# Patient Record
Sex: Female | Born: 1965 | Race: White | Hispanic: No | Marital: Married | Smoking: Never smoker
Health system: Southern US, Community
[De-identification: ages and names within clinical notes are randomized; demographics above are authoritative.]

## PROBLEM LIST (undated history)

## (undated) ENCOUNTER — Emergency Department (HOSPITAL_BASED_OUTPATIENT_CLINIC_OR_DEPARTMENT_OTHER): Admission: EM | Payer: BLUE CROSS/BLUE SHIELD

## (undated) DIAGNOSIS — R350 Frequency of micturition: Secondary | ICD-10-CM

## (undated) DIAGNOSIS — R300 Dysuria: Secondary | ICD-10-CM

## (undated) DIAGNOSIS — N3945 Continuous leakage: Secondary | ICD-10-CM

## (undated) DIAGNOSIS — R011 Cardiac murmur, unspecified: Secondary | ICD-10-CM

## (undated) DIAGNOSIS — G43909 Migraine, unspecified, not intractable, without status migrainosus: Secondary | ICD-10-CM

## (undated) HISTORY — DX: Continuous leakage: N39.45

## (undated) HISTORY — DX: Migraine, unspecified, not intractable, without status migrainosus: G43.909

## (undated) HISTORY — DX: Cardiac murmur, unspecified: R01.1

## (undated) HISTORY — DX: Dysuria: R30.0

## (undated) HISTORY — DX: Frequency of micturition: R35.0

## (undated) HISTORY — PX: OTHER SURGICAL HISTORY: SHX169

---

## 2005-12-16 ENCOUNTER — Ambulatory Visit: Payer: Self-pay

## 2006-03-14 ENCOUNTER — Emergency Department: Payer: Self-pay | Admitting: Emergency Medicine

## 2006-12-31 ENCOUNTER — Ambulatory Visit: Payer: Self-pay

## 2008-02-11 ENCOUNTER — Ambulatory Visit: Payer: Self-pay

## 2009-06-12 ENCOUNTER — Ambulatory Visit: Payer: Self-pay

## 2011-01-14 ENCOUNTER — Ambulatory Visit: Payer: Self-pay | Admitting: Family Medicine

## 2015-02-09 ENCOUNTER — Ambulatory Visit (INDEPENDENT_AMBULATORY_CARE_PROVIDER_SITE_OTHER): Payer: BLUE CROSS/BLUE SHIELD | Admitting: Urology

## 2015-02-09 ENCOUNTER — Encounter: Payer: Self-pay | Admitting: *Deleted

## 2015-02-09 VITALS — BP 145/75 | HR 70 | Ht 69.0 in | Wt 160.4 lb

## 2015-02-09 DIAGNOSIS — N390 Urinary tract infection, site not specified: Secondary | ICD-10-CM

## 2015-02-09 DIAGNOSIS — N3090 Cystitis, unspecified without hematuria: Secondary | ICD-10-CM | POA: Insufficient documentation

## 2015-02-09 DIAGNOSIS — N952 Postmenopausal atrophic vaginitis: Secondary | ICD-10-CM | POA: Insufficient documentation

## 2015-02-09 LAB — URINALYSIS, COMPLETE
Bilirubin, UA: NEGATIVE
GLUCOSE, UA: NEGATIVE
Ketones, UA: NEGATIVE
Nitrite, UA: NEGATIVE
PROTEIN UA: NEGATIVE
Specific Gravity, UA: 1.015 (ref 1.005–1.030)
UUROB: 0.2 mg/dL (ref 0.2–1.0)
pH, UA: 7.5 (ref 5.0–7.5)

## 2015-02-09 LAB — MICROSCOPIC EXAMINATION: BACTERIA UA: NONE SEEN

## 2015-02-09 LAB — BLADDER SCAN AMB NON-IMAGING: Scan Result: 0

## 2015-02-09 NOTE — Progress Notes (Signed)
02/09/2015 2:03 PM   Susan Garcia 1965-07-04 [id number]  Referring provider: No referring provider defined for this encounter.  Chief Complaint  Patient presents with  . Recurrent UTI    referral from [facility] Bobby Rumpf MD    HPI:  1 - Recurrent Cystitis - pt with ocacional episode of simple cystitis up to every other month. Symptoms dysuria, freqency that improve after specific therapy. No febrile UTI / pyelo hospitalizations. No recent positive CX data to review. Pelvic 01/2015 with very mild atrophic changes. PVR 01/2015 "0mL"  2 - Atrophic Vaginitis - very mild early atrophic changes by pelvic 01/2015 on eval recurrent UTI. Is sexually active. Is s/p hyst with ovaries in situ. No h/o estrogen sensitive malignancy.   PMH sig for lap hyst (benign), headaches. No CV disease. No strong blood thinners.   [name]   PMH: Past Medical History  Diagnosis Date  . Dysuria   . Urinary frequency   . Murmur   . Migraine headache   . Continuous leakage of urine     Surgical History: Past Surgical History  Procedure Laterality Date  . Removal etopic pragnancy    . Laproscopic hysterectomy      Home Medications:    Medication List       This list is accurate as of: 02/09/15  2:03 PM.  Always use your most recent med list.               diclofenac 50 MG tablet  Commonly known as:  CATAFLAM  Take 50 mg by mouth 3 (three) times daily.     gabapentin 100 MG capsule  Commonly known as:  NEURONTIN  TK ONE C PO  BID PRN     meclizine 25 MG tablet  Commonly known as:  ANTIVERT  Take 25 mg by mouth 3 (three) times daily as needed for dizziness.     nortriptyline 10 MG capsule  Commonly known as:  PAMELOR  Take 10 mg by mouth at bedtime.     phenazopyridine 200 MG tablet  Commonly known as:  PYRIDIUM  Take 200 mg by mouth 3 (three) times daily as needed for pain.     promethazine 25 MG tablet  Commonly known as:   PHENERGAN  Take 25 mg by mouth every 6 (six) hours as needed for nausea or vomiting.     triamcinolone cream 0.5 %  Commonly known as:  KENALOG  Apply 1 application topically 3 (three) times daily.     venlafaxine 37.5 MG tablet  Commonly known as:  EFFEXOR  Take 37.5 mg by mouth 2 (two) times daily.        Allergies:  Allergies  Allergen Reactions  . Darvon [Propoxyphene]   . Percocet [Oxycodone-Acetaminophen]     Family History: Family History  Problem Relation Age of Onset  . Diabetes type II Mother   . Hypertension Mother   . Diabetes type II Maternal Grandmother   . Hypertension Maternal Grandmother   . Cancer Maternal Grandmother   . Kidney disease Neg Hx     Social History:  reports that she has never smoked. She does not have any smokeless tobacco history on file. She reports that she drinks alcohol. She reports that she does not use illicit drugs.  ROS: UROLOGY Frequent Urination?: Yes Hard to postpone urination?: Yes Burning/pain with urination?: No Get up at night to urinate?: No Leakage of urine?: Yes Urine stream  starts and stops?: No Trouble starting stream?: No Do you have to strain to urinate?: No Blood in urine?: No Urinary tract infection?: No Sexually transmitted disease?: No Injury to kidneys or bladder?: No Painful intercourse?: No Weak stream?: No Currently pregnant?: No Vaginal bleeding?: No Last menstrual period?: nn  Gastrointestinal Nausea?: No Vomiting?: No Indigestion/heartburn?: No Diarrhea?: No Constipation?: No  Constitutional Fever: No Night sweats?: No Weight loss?: No Fatigue?: No  Skin Skin rash/lesions?: No Itching?: No  Eyes Blurred vision?: No Double vision?: No  Ears/Nose/Throat Sore throat?: No Sinus problems?: No  Hematologic/Lymphatic Swollen glands?: No Easy bruising?: No  Cardiovascular Leg swelling?: No Chest pain?: No  Respiratory Cough?: No Shortness of breath?:  No  Endocrine Excessive thirst?: No  Musculoskeletal Back pain?: No Joint pain?: No  Neurological Headaches?: Yes Dizziness?: Yes  Psychologic Depression?: No Anxiety?: No  Physical Exam: BP 145/75 mmHg  Pulse 70  Ht  (1.753 m)  Wt 160 lb 6.4 oz (72.757 kg)  BMI 23.68 kg/m2  Constitutional:  Alert and oriented, No acute distress. HEENT: [place] AT, moist mucus membranes.  Trachea midline, no masses. Cardiovascular: No clubbing, cyanosis, or edema. Respiratory: Normal respiratory effort, no increased work of breathing. GI: Abdomen is soft, nontender, nondistended, no abdominal masses GU: No CVA tenderness. Mild vaginal atrophy. Cervix / uterus surgiclaly absent.  Skin: No rashes, bruises or suspicious lesions. Lymph: No cervical or inguinal adenopathy. Neurologic: Grossly intact, no focal deficits, moving all 4 extremities. Psychiatric: Normal mood and affect.  Laboratory Data: No results found for: WBC, HGB, HCT, MCV, PLT  No results found for: CREATININE  No results found for: PSA  No results found for: TESTOSTERONE  No results found for: HGBA1C  Urinalysis No results found for: COLORURINE, APPEARANCEUR, LABSPEC, PHURINE, GLUCOSEU, HGBUR, BILIRUBINUR, KETONESUR, PROTEINUR, UROBILINOGEN, NITRITE, LEUKOCYTESUR  Pertinent Imaging:  PVR "0mL"  Assessment & Plan:    1 - Recurrent Cystitis - discssed w/o with goal of ruling out surgicaly modifiable factors with pelvic, PVR, KUB, Renal US. Exam today with only mild vaginal atrophy.   KUB, Renal US on return to r/o stones / hydro. If normal, then consider estrace twice weekly and keflex self-start.   2 - Atrophic Vaginitis - consider estrace twice weekly if eval otherise unremarkable.  3 - RTC 1 mo with KUB, Renal US.    Return in about 4 weeks (around 03/09/2015).  Sebastian Ache, MD  [facility] [address], [address] [place], [place] [address] [phone]

## 2015-02-14 ENCOUNTER — Other Ambulatory Visit: Payer: Self-pay

## 2015-02-14 ENCOUNTER — Other Ambulatory Visit: Payer: Self-pay | Admitting: Urology

## 2015-02-14 DIAGNOSIS — N390 Urinary tract infection, site not specified: Secondary | ICD-10-CM

## 2015-03-09 ENCOUNTER — Ambulatory Visit: Payer: BLUE CROSS/BLUE SHIELD | Admitting: Urology

## 2015-03-09 ENCOUNTER — Encounter: Payer: Self-pay | Admitting: Urology

## 2015-03-26 ENCOUNTER — Telehealth: Payer: Self-pay | Admitting: Urology

## 2015-03-26 NOTE — Telephone Encounter (Signed)
Pt called.  She received a no show letter from [place].  She stated that she never received a call from the hospital to schedule her tests and that she did not want to return to see [place].  I double checked her telephone # and we had an incorrect #.  I changed the # to the correct # and apologized to the patient, offered to schedule and offered to give her the # for scheduling at the hospital.  Pt refused and said she would call us back if she was in anymore pain or if she needed [place].

## 2015-12-13 ENCOUNTER — Other Ambulatory Visit: Payer: Self-pay | Admitting: Obstetrics & Gynecology

## 2015-12-13 DIAGNOSIS — Z1231 Encounter for screening mammogram for malignant neoplasm of breast: Secondary | ICD-10-CM

## 2015-12-26 ENCOUNTER — Other Ambulatory Visit: Payer: Self-pay | Admitting: Obstetrics & Gynecology

## 2015-12-26 ENCOUNTER — Ambulatory Visit
Admission: RE | Admit: 2015-12-26 | Discharge: 2015-12-26 | Disposition: A | Discharge status: Home / Self Care | Payer: BLUE CROSS/BLUE SHIELD | Source: Ambulatory Visit | Attending: Obstetrics & Gynecology | Admitting: Obstetrics & Gynecology

## 2015-12-26 DIAGNOSIS — Z1231 Encounter for screening mammogram for malignant neoplasm of breast: Secondary | ICD-10-CM | POA: Diagnosis present

## 2016-07-08 ENCOUNTER — Emergency Department
Admission: EM | Admit: 2016-07-08 | Discharge: 2016-07-08 | Disposition: A | Discharge status: Home / Self Care | Payer: Worker's Compensation | Attending: Emergency Medicine | Admitting: Emergency Medicine

## 2016-07-08 ENCOUNTER — Encounter: Payer: Self-pay | Admitting: *Deleted

## 2016-07-08 ENCOUNTER — Emergency Department: Payer: Worker's Compensation

## 2016-07-08 DIAGNOSIS — Y929 Unspecified place or not applicable: Secondary | ICD-10-CM | POA: Insufficient documentation

## 2016-07-08 DIAGNOSIS — M79602 Pain in left arm: Secondary | ICD-10-CM | POA: Diagnosis not present

## 2016-07-08 DIAGNOSIS — Y999 Unspecified external cause status: Secondary | ICD-10-CM | POA: Diagnosis not present

## 2016-07-08 DIAGNOSIS — S4992XA Unspecified injury of left shoulder and upper arm, initial encounter: Secondary | ICD-10-CM | POA: Diagnosis present

## 2016-07-08 DIAGNOSIS — W010XXA Fall on same level from slipping, tripping and stumbling without subsequent striking against object, initial encounter: Secondary | ICD-10-CM | POA: Insufficient documentation

## 2016-07-08 DIAGNOSIS — Y939 Activity, unspecified: Secondary | ICD-10-CM | POA: Diagnosis not present

## 2016-07-08 DIAGNOSIS — W19XXXA Unspecified fall, initial encounter: Secondary | ICD-10-CM

## 2016-07-08 NOTE — ED Triage Notes (Signed)
Patient states she tripped over a rug today at work and fell on left hand.

## 2016-07-08 NOTE — ED Provider Notes (Signed)
[facility] Emergency Department Provider Note  ____________________________________________  Time seen: Approximately 3:19 PM  I have reviewed the triage vital signs and the nursing notes.   HISTORY  Chief Complaint Fall    HPI Susan Garcia is a 51 y.o. female presents to emergency department after falling on left hand after tripping on a rug. She states that she is having pain over her pinky finger and down hand. Patient states that it has been swelling since the injury. Patient says there is some numbness but she can feel the tip of her finger. Patient denies any additional injuries. No head trauma or loss of consciousness. Patient denies any shoulder, elbow, wrist pain. Patient has not taken anything for pain.   Past Medical History:  Diagnosis Date  . Continuous leakage of urine   . Dysuria   . Migraine headache   . Murmur   . Urinary frequency     Patient Active Problem List   Diagnosis Date Noted  . Recurrent cystitis 02/09/2015  . Atrophic vaginitis 02/09/2015    Past Surgical History:  Procedure Laterality Date  . laproscopic hysterectomy    . removal etopic pragnancy      Prior to Admission medications   Medication Sig Start Date End Date Taking? Authorizing Provider  diclofenac (CATAFLAM) 50 MG tablet Take 50 mg by mouth 3 (three) times daily.    Historical Provider, MD  gabapentin (NEURONTIN) 100 MG capsule TK ONE C PO  BID PRN 11/16/14   Historical Provider, MD  meclizine (ANTIVERT) 25 MG tablet Take 25 mg by mouth 3 (three) times daily as needed for dizziness.    Historical Provider, MD  nortriptyline (PAMELOR) 10 MG capsule Take 10 mg by mouth at bedtime.    Historical Provider, MD  venlafaxine (EFFEXOR) 37.5 MG tablet Take 37.5 mg by mouth 2 (two) times daily.    Historical Provider, MD    Allergies Darvon [propoxyphene] and Percocet [oxycodone-acetaminophen]  Family History  Problem Relation Age of Onset  . Diabetes type II  Mother   . Hypertension Mother   . Diabetes type II Maternal Grandmother   . Hypertension Maternal Grandmother   . Cancer Maternal Grandmother   . Breast cancer Maternal Grandmother   . Kidney disease Neg Hx     Social History Social History  Substance Use Topics  . Smoking status: Never Smoker  . Smokeless tobacco: Not on file  . Alcohol use 0.0 oz/week     Comment: social     Review of Systems  Constitutional: No fever/chills ENT: No upper respiratory complaints. Cardiovascular: No chest pain. Respiratory: No cough. No SOB. Gastrointestinal: No abdominal pain.  No nausea, no vomiting.  Musculoskeletal: Negative for musculoskeletal pain. Skin: Negative for rash, abrasions, lacerations, ecchymosis. Neurological: Negative for headaches, numbness or tingling   ____________________________________________   PHYSICAL EXAM:  VITAL SIGNS: ED Triage Vitals  Enc Vitals Group     BP 07/08/16 1336 (!) 176/64     Pulse Rate 07/08/16 1336 67     Resp 07/08/16 1336 18     Temp 07/08/16 1336 97.9 F (36.6 C)     Temp Source 07/08/16 1336 Oral     SpO2 07/08/16 1336 100 %     Weight 07/08/16 1339 164 lb (74.4 kg)     Height 07/08/16 1339 5\' 9"  (1.753 m)     Head Circumference --      Peak Flow --      Pain Score 07/08/16 1339  8     Pain Loc --      Pain Edu? --      Excl. in GC? --      Constitutional: Alert and oriented. Well appearing and in no acute distress. Eyes: Conjunctivae are normal. PERRL. EOMI. Head: Atraumatic. ENT:      Ears:      Nose: No congestion/rhinnorhea.      Mouth/Throat: Mucous membranes are moist.  Neck: No stridor.   Cardiovascular: Normal rate, regular rhythm. Good peripheral circulation. 2+ radial pulses. Respiratory: Normal respiratory effort without tachypnea or retractions. Lungs CTAB. Good air entry to the bases with no decreased or absent breath sounds. Musculoskeletal: Tenderness to palpation over her ulnar aspect of left hand. Mild  swelling present over ulnar side. Neurologic:  Normal speech and language. No gross focal neurologic deficits are appreciated. Sensation of fingers intact. Skin:  Skin is warm, dry and intact. No rash noted. Psychiatric: Mood and affect are normal. Speech and behavior are normal. Patient exhibits appropriate insight and judgement.   ____________________________________________   LABS (all labs ordered are listed, but only abnormal results are displayed)  Labs Reviewed - No data to display ____________________________________________  EKG   ____________________________________________  RADIOLOGY Lexine BatonI, Larrisha Babineau, personally viewed and evaluated these images (plain radiographs) as part of my medical decision making, as well as reviewing the written report by the radiologist.  Dg Hand Complete Left  Result Date: 07/08/2016 CLINICAL DATA:  Injury EXAM: LEFT HAND - COMPLETE 3+ VIEW COMPARISON:  None. FINDINGS: No acute fracture. No dislocation.  Unremarkable soft tissues. IMPRESSION: No acute bony pathology. Electronically Signed   By: Jolaine ClickArthur  Hoss M.D.   On: 07/08/2016 14:55    ____________________________________________    PROCEDURES  Procedure(s) performed:    Procedures    Medications - No data to display   ____________________________________________   INITIAL IMPRESSION / ASSESSMENT AND PLAN / ED COURSE  Pertinent labs & imaging results that were available during my care of the patient were reviewed by me and considered in my medical decision making (see chart for details).  Review of the [place] CSRS was performed in accordance of the [facility] prior to dispensing any controlled drugs.     Patient's diagnosis is consistent with hand pain after fall. Vital signs and exam are reassuring. No acute bony abnormalities seen on x-ray. Patient does not request anything for pain. Patient is to follow up with orthopedics as directed. Patient is given ED precautions to return to  the ED for any worsening or new symptoms.     ____________________________________________  FINAL CLINICAL IMPRESSION(S) / ED DIAGNOSES  Final diagnoses:  Fall, initial encounter      NEW MEDICATIONS STARTED DURING THIS VISIT:  Current Discharge Medication List          This chart was dictated using voice recognition software/Dragon. Despite best efforts to proofread, errors can occur which can change the meaning. Any change was purely unintentional.    Enid DerryAshley Nakira Litzau, PA-C 07/08/16 1529    Phineas SemenGraydon Goodman, MD 07/09/16 1406

## 2016-07-08 NOTE — ED Notes (Signed)
See triage note  Tripped over rug   Having pain to left arm

## 2017-06-16 IMAGING — DX DG HAND COMPLETE 3+V*L*
3 series · 3 of 3 positions shown · non-contrast
Comparison: None.

CLINICAL DATA: Injury

EXAM:
LEFT HAND - COMPLETE 3+ VIEW

[hand ap]
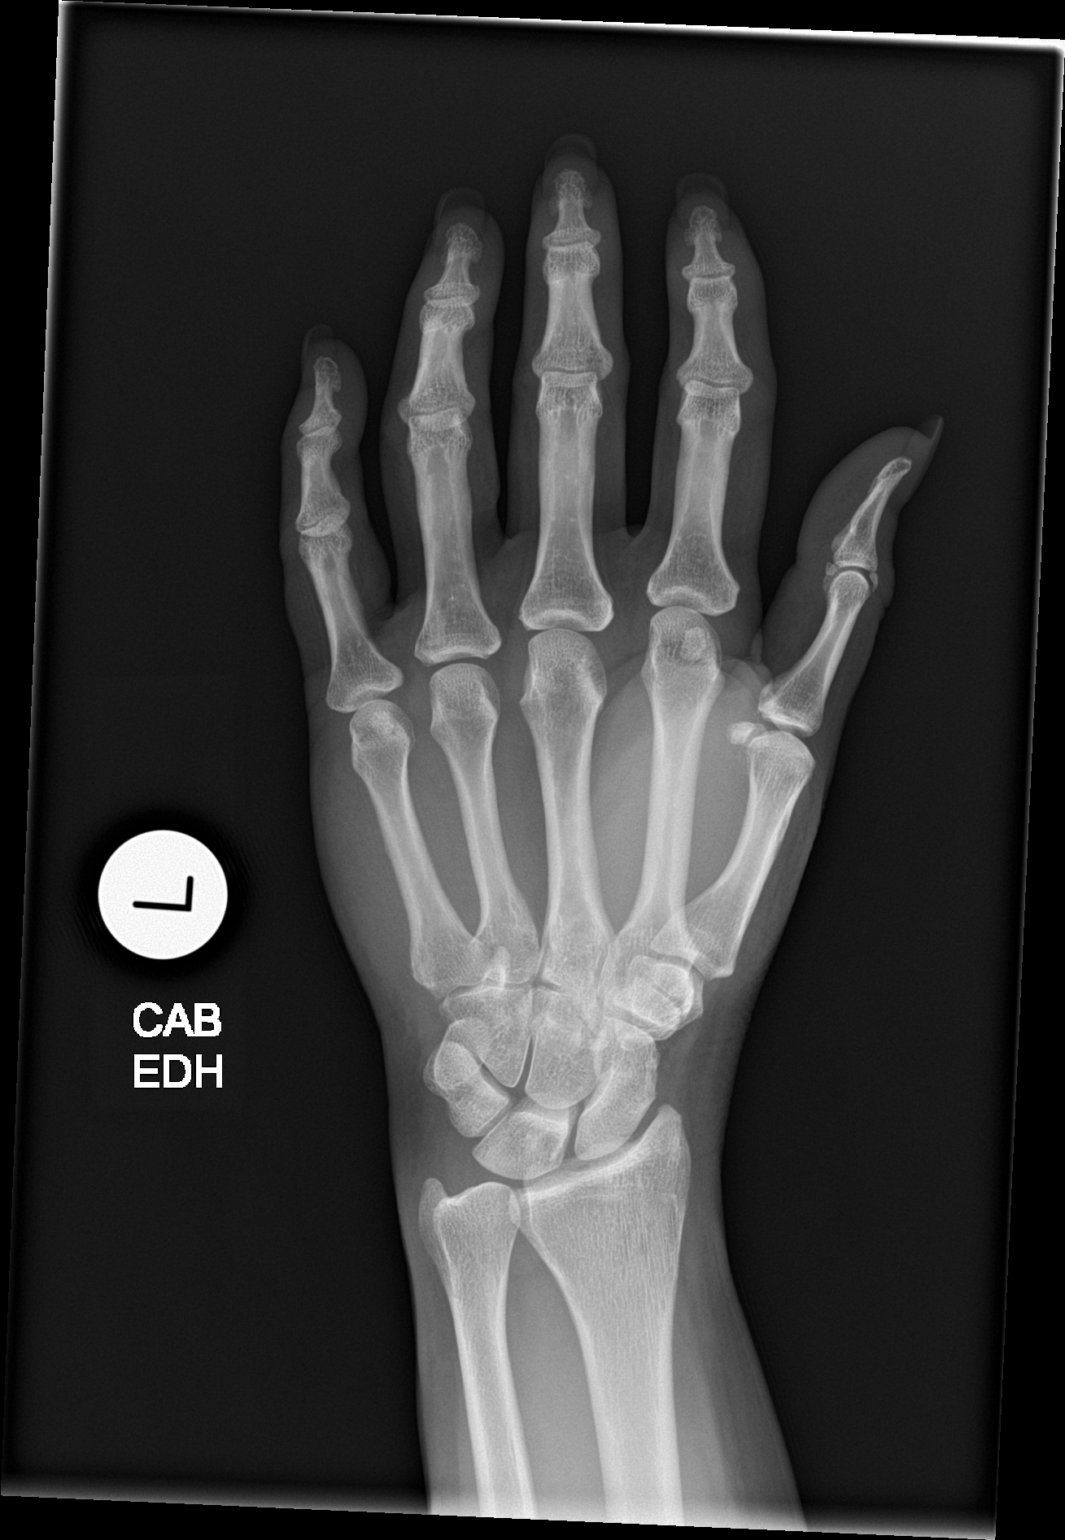

[hand obl]
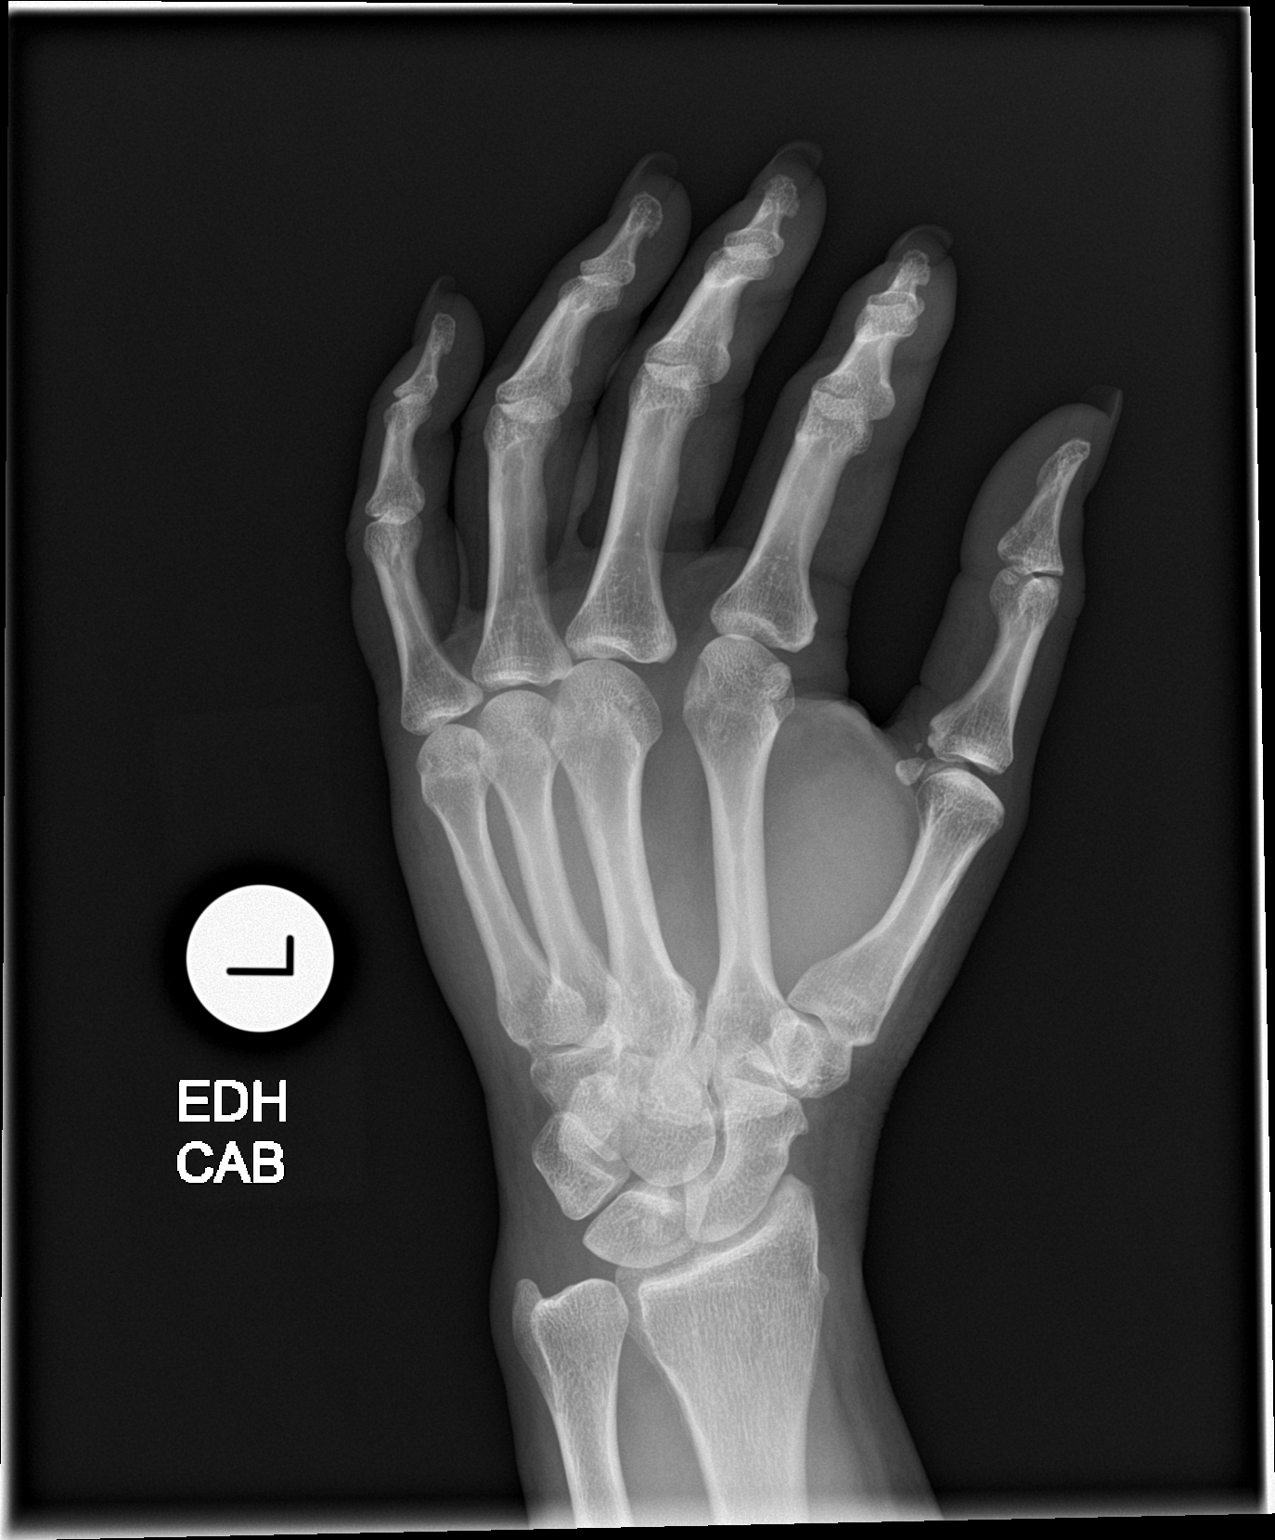

[hand lat]
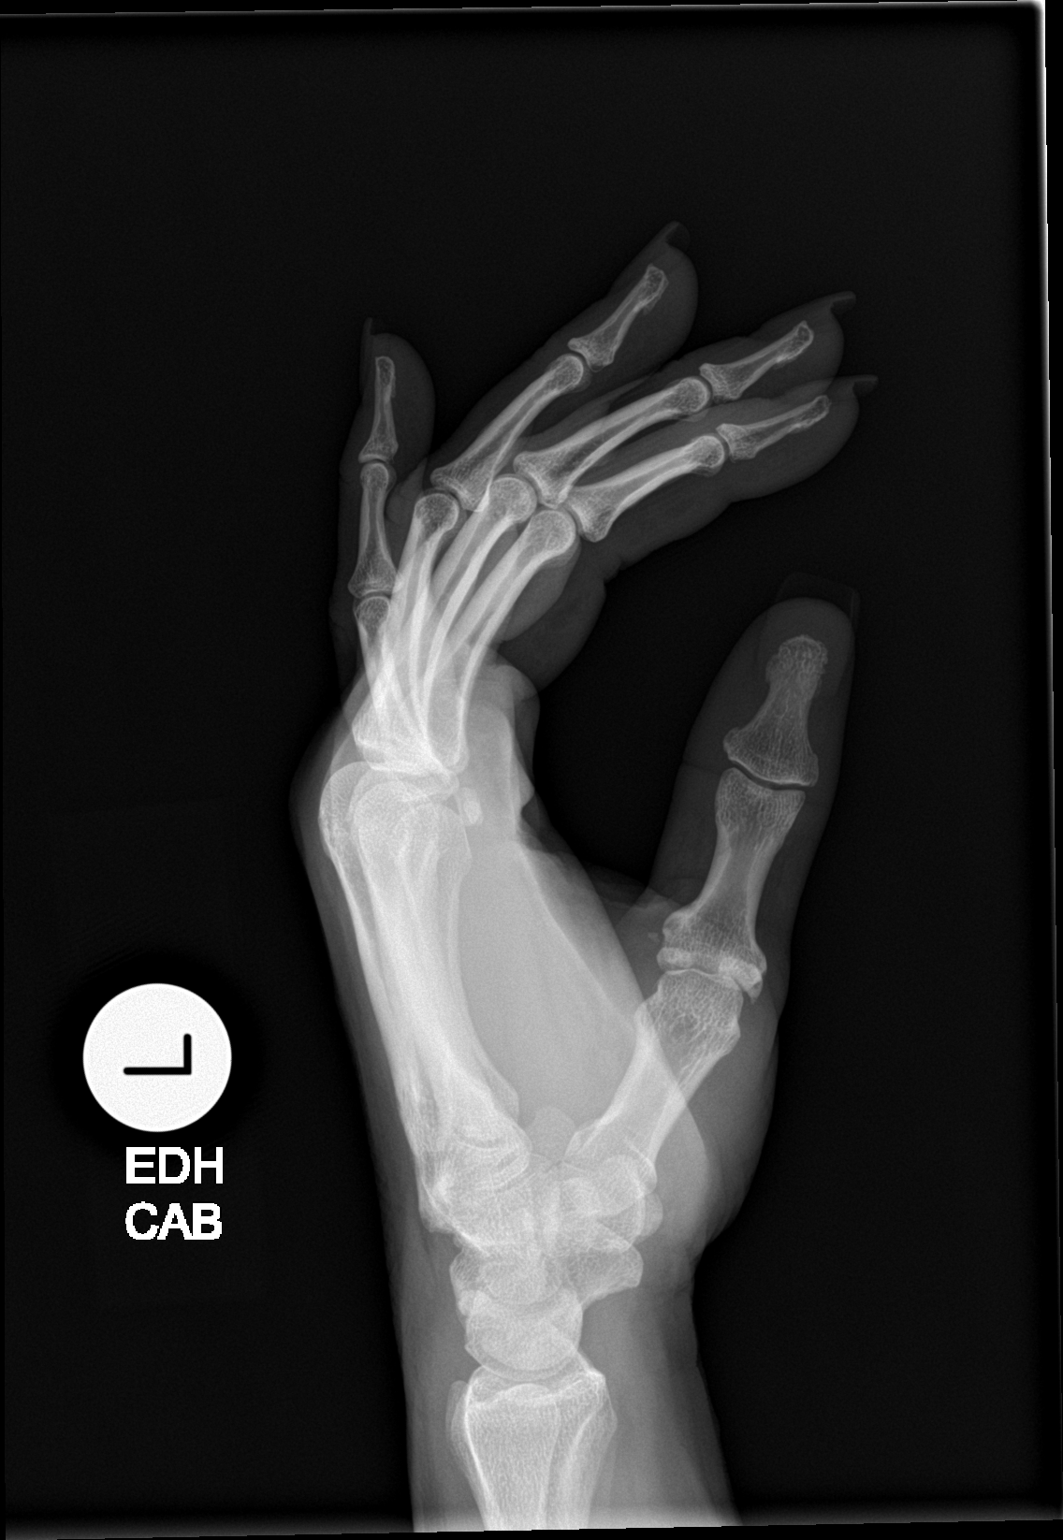

[3 of 3 positions shown; findings below may reference images not displayed]

FINDINGS: No acute fracture. No dislocation.  Unremarkable soft tissues.
IMPRESSION: No acute bony pathology.
